# Patient Record
Sex: Male | Born: 1960 | Race: White | Hispanic: No | Marital: Married | Smoking: Never smoker
Health system: Southern US, Community
[De-identification: ages and names within clinical notes are randomized; demographics above are authoritative.]

## PROBLEM LIST (undated history)

## (undated) DIAGNOSIS — S32010A Wedge compression fracture of first lumbar vertebra, initial encounter for closed fracture: Secondary | ICD-10-CM

## (undated) HISTORY — DX: Wedge compression fracture of first lumbar vertebra, initial encounter for closed fracture: S32.010A

---

## 1984-11-09 DIAGNOSIS — S32010A Wedge compression fracture of first lumbar vertebra, initial encounter for closed fracture: Secondary | ICD-10-CM

## 1984-11-09 HISTORY — DX: Wedge compression fracture of first lumbar vertebra, initial encounter for closed fracture: S32.010A

## 2003-04-25 ENCOUNTER — Emergency Department (HOSPITAL_COMMUNITY): Admission: EM | Admit: 2003-04-25 | Discharge: 2003-04-25 | Payer: Self-pay | Admitting: Emergency Medicine

## 2018-12-15 ENCOUNTER — Encounter: Payer: Self-pay | Admitting: Physician Assistant

## 2018-12-15 ENCOUNTER — Ambulatory Visit: Payer: Self-pay | Admitting: Physician Assistant

## 2018-12-15 VITALS — BP 130/72 | HR 93 | Temp 100.2°F | Resp 14 | Wt 240.2 lb

## 2018-12-15 DIAGNOSIS — J111 Influenza due to unidentified influenza virus with other respiratory manifestations: Secondary | ICD-10-CM

## 2018-12-15 DIAGNOSIS — R69 Illness, unspecified: Secondary | ICD-10-CM

## 2018-12-15 MED ORDER — SALINE SPRAY 0.65 % NA SOLN: 1.0000 | NASAL | 0 refills | Status: DC | PRN

## 2018-12-15 MED ORDER — OSELTAMIVIR PHOSPHATE 75 MG PO CAPS: 75.0000 mg | ORAL_CAPSULE | Freq: Two times a day (BID) | ORAL | 0 refills | Status: DC

## 2018-12-15 MED ORDER — PROMETHAZINE-DM 6.25-15 MG/5ML PO SYRP: 5.0000 mL | ORAL_SOLUTION | Freq: Four times a day (QID) | ORAL | 0 refills | Status: DC | PRN

## 2018-12-15 MED ORDER — BENZOCAINE-MENTHOL 15-3.6 MG MT LOZG: 1.0000 | LOZENGE | OROMUCOSAL | 0 refills | Status: DC | PRN

## 2018-12-15 MED ORDER — BENZONATATE 100 MG PO CAPS: 100.0000 mg | ORAL_CAPSULE | Freq: Three times a day (TID) | ORAL | 0 refills | Status: DC | PRN

## 2018-12-15 NOTE — Progress Notes (Signed)
MRN: [id number] DOB: 1961/03/26  Subjective:   Jeffery Blair is a 58 y.o. male presenting for chief complaint of flu like illness.  Reports sudden onset body aches, fever, chills, sore throat, headache, runny nose, and dry cough 1 day ago. Was fine at work then got home and immediately felt sick. Denies sinus pain, inability to swallow, voice change, productive cough, wheezing, shortness of breath and chest pain, nausea, vomiting, abdominal pain and diarrhea. Has had sick contact with coworker, w/ similar sx. Has tried [facility]BC powder with some relief. Patient has not had flu shot this season. No known hx of asthma, COPD, DM, HTN, autoimmune disorder but also never goes to the doctor. Denies smoking. Denies recent travel outside the [place]. Denies any other aggravating or relieving factors, no other questions or concerns.  Review of Systems  Respiratory: Negative for hemoptysis.   Musculoskeletal: Negative for neck pain.  Skin: Negative for rash.  Neurological: Negative for dizziness and weakness.    Jeffery Blair currently has no medications in their medication list. Also has No Known Allergies.  Jeffery Blair  has no past medical history on file. Also  has no past surgical history on file.   Objective:   Vitals: BP 130/72   Pulse 93   Temp 100.2 F (37.9 C)   Resp 14   Wt 240 lb 3.2 oz (109 kg)   SpO2 96%   Physical Exam Vitals signs reviewed.  Constitutional:      General: He is not in acute distress.    Appearance: He is well-developed. He is not ill-appearing or toxic-appearing.  HENT:     Head: Normocephalic and atraumatic.     Right Ear: Ear canal and external ear normal. Tympanic membrane is injected (mildly). Tympanic membrane is not bulging.     Left Ear: Tympanic membrane, ear canal and external ear normal.     Nose: Congestion and rhinorrhea (clear) present.     Right Sinus: No maxillary sinus tenderness or frontal sinus tenderness.     Left Sinus: No maxillary sinus tenderness or  frontal sinus tenderness.     Mouth/Throat:     Lips: Pink.     Mouth: Mucous membranes are moist.     Pharynx: Uvula midline. Posterior oropharyngeal erythema present. No pharyngeal swelling, oropharyngeal exudate or uvula swelling.     Tonsils: No tonsillar exudate or tonsillar abscesses. Swelling: 1+ on the right. 1+ on the left.  Eyes:     Conjunctiva/sclera: Conjunctivae normal.  Neck:     Musculoskeletal: Normal range of motion.  Cardiovascular:     Rate and Rhythm: Normal rate and regular rhythm.     Heart sounds: Normal heart sounds.  Pulmonary:     Effort: Pulmonary effort is normal. No respiratory distress.     Breath sounds: Rhonchi (few rhonchi auscultated in RUF-cleared with cough) present. No decreased breath sounds, wheezing or rales.  Lymphadenopathy:     Head:     Right side of head: No submental, submandibular, tonsillar, preauricular, posterior auricular or occipital adenopathy.     Left side of head: No submental, submandibular, tonsillar, preauricular, posterior auricular or occipital adenopathy.     Cervical: No cervical adenopathy.     Upper Body:     Right upper body: No supraclavicular adenopathy.     Left upper body: No supraclavicular adenopathy.  Skin:    General: Skin is warm and dry.     Comments: Skin is very warm to palpation.   Neurological:  Mental Status: He is alert.     No results found for this or any previous visit (from the past 24 hour(s)).  Assessment and Plan :  1. Influenza-like illness Overall well appearing, NAD.  Temp elevated at 100.2-has not had any antipyretics today. Few rhonchi noted on exam, cleared with cough. Sudden onset of sx set is consistent with influenza despite negative POC flu. Discussed natural history of the disease. Discussed limitations of our office with pt in regards to imaging/labs.  Considering he does not follow up with a family doctor and does not recall ever having lab work as an adult-would recommend  antiviral therapy along with supportive measures at this time with close f/u at urgent care if not responding to therapy.   Rx for tamiflu provided. Encouraged rest, hydration, and to continue OTC tylenol or ibuprofen as prescribed for fever. 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 Recommended wearing a mask daily especially around other people. Educated on potential complications of the flu. Advised to follow up with urgent care or ED if develops any of these concerning symptoms or if current symptoms persist outside of discussed boundaries. Pt voices understanding.  - oseltamivir (TAMIFLU) 75 MG capsule; Take 1 capsule (75 mg total) by mouth 2 (two) times daily.  Dispense: 10 capsule; Refill: 0 - promethazine-dextromethorphan (PROMETHAZINE-DM) 6.25-15 MG/5ML syrup; Take 5 mLs by mouth 4 (four) times daily as needed for cough.  Dispense: 118 mL; Refill: 0 - benzonatate (TESSALON) 100 MG capsule; Take 1-2 capsules (100-200 mg total) by mouth 3 (three) times daily as needed for cough.  Dispense: 40 capsule; Refill: 0 - Benzocaine-Menthol (CEPACOL SORE THROAT) 15-3.6 MG LOZG; Use as directed 1 lozenge in the mouth or throat every 4 (four) hours as needed.  Dispense: 16 each; Refill: 0 - sodium chloride (OCEAN) 0.65 % SOLN nasal spray; Place 1 spray into both nostrils as needed.  Dispense: 60 mL; Refill: 0    Benjiman CoreBrittany Shalicia Craghead, PA-C  [facility] 12/15/2018 9:40 AM

## 2018-12-15 NOTE — Patient Instructions (Addendum)
Influenza, Adult   Your symptoms are consistent with the flu, you are contagious until you are fever free for 24 hours. You may take tamiflu to help with symptoms and duration. You can use over the counter tylenol or ibuprofen for general discomfort and fever. Use tessalon perles for cough. Promethazine DM at night time for cough. Nasal saline rinses for nasal congestion.  Two major complications after the flu are pneumonia and sinus infections. Please be aware of this and if you are not any better in 7-10 days or you develop worsening cough or sinus pressure, seek care at urgent care or the ED. Continue to wash your hands and wear a mask daily especially around other people.    Influenza is also called "the flu." It is an infection in the lungs, nose, and throat (respiratory tract). It is caused by a virus. The flu causes symptoms that are similar to symptoms of a cold. It also causes a high fever and body aches. The flu spreads easily from person to person (is contagious). Getting a flu shot (influenza vaccination) every year is the best way to prevent the flu. What are the causes? This condition is caused by the influenza virus. You can get the virus by:  Breathing in droplets that are in the air from the cough or sneeze of a person who has the virus.  Touching something that has the virus on it (is contaminated) and then touching your mouth, nose, or eyes. What increases the risk? Certain things may make you more likely to get the flu. These include:  Not washing your hands often.  Having close contact with many people during cold and flu season.  Touching your mouth, eyes, or nose without first washing your hands.  Not getting a flu shot every year. You may have a higher risk for the flu, along with serious problems such as a lung infection (pneumonia), if you:  Are older than 65.  Are pregnant.  Have a weakened disease-fighting system (immune system) because of a disease or  taking certain medicines.  Have a long-term (chronic) illness, such as: ? Heart, kidney, or lung disease. ? Diabetes. ? Asthma.  Have a liver disorder.  Are very overweight (morbidly obese).  Have anemia. This is a condition that affects your red blood cells. What are the signs or symptoms? Symptoms usually begin suddenly and last 4-14 days. They may include:  Fever and chills.  Headaches, body aches, or muscle aches.  Sore throat.  Cough.  Runny or stuffy (congested) nose.  Chest discomfort.  Not wanting to eat as much as normal (poor appetite).  Weakness or feeling tired (fatigue).  Dizziness.  Feeling sick to your stomach (nauseous) or throwing up (vomiting). How is this treated? If the flu is found early, you can be treated with medicine that can help reduce how bad the illness is and how long it lasts (antiviral medicine). This may be given by mouth (orally) or through an IV tube. Taking care of yourself at home can help your symptoms get better. Your doctor may suggest:  Taking over-the-counter medicines.  Drinking plenty of fluids. The flu often goes away on its own. If you have very bad symptoms or other problems, you may be treated in a hospital. Follow these instructions at home:     Activity  Rest as needed. Get plenty of sleep.  Stay home from work or school as told by your doctor. ? Do not leave home until you do not  have a fever for 24 hours without taking medicine. ? Leave home only to visit your doctor. Eating and drinking  Take an ORS (oral rehydration solution). This is a drink that is sold at pharmacies and stores.  Drink enough fluid to keep your pee (urine) pale yellow.  Drink clear fluids in small amounts as you are able. Clear fluids include: ? Water. ? Ice chips. ? Fruit juice that has water added (diluted fruit juice). ? Low-calorie sports drinks.  Eat bland, easy-to-digest foods in small amounts as you are able. These foods  include: ? Bananas. ? Applesauce. ? Rice. ? Lean meats. ? Toast. ? Crackers.  Do not eat or drink: ? Fluids that have a lot of sugar or caffeine. ? Alcohol. ? Spicy or fatty foods. General instructions  Take over-the-counter and prescription medicines only as told by your doctor.  Use a cool mist humidifier to add moisture to the air in your home. This can make it easier for you to breathe.  Cover your mouth and nose when you cough or sneeze.  Wash your hands with soap and water often, especially after you cough or sneeze. If you cannot use soap and water, use alcohol-based hand sanitizer.  Keep all follow-up visits as told by your doctor. This is important. How is this prevented?   Get a flu shot every year. You may get the flu shot in late summer, fall, or winter. Ask your doctor when you should get your flu shot.  Avoid contact with people who are sick during fall and winter (cold and flu season). Contact a doctor if:  You get new symptoms.  You have: ? Chest pain. ? Watery poop (diarrhea). ? A fever.  Your cough gets worse.  You start to have more mucus.  You feel sick to your stomach.  You throw up. Get help right away if you:  Have shortness of breath.  Have trouble breathing.  Have skin or nails that turn a bluish color.  Have very bad pain or stiffness in your neck.  Get a sudden headache.  Get sudden pain in your face or ear.  Cannot eat or drink without throwing up. Summary  Influenza ("the flu") is an infection in the lungs, nose, and throat. It is caused by a virus.  Take over-the-counter and prescription medicines only as told by your doctor.  Getting a flu shot every year is the best way to avoid getting the flu. This information is not intended to replace advice given to you by your health care provider. Make sure you discuss any questions you have with your health care provider. Document Released: 08/04/2008 Document Revised:  04/13/2018 Document Reviewed: 04/13/2018 Elsevier Interactive Patient Education  2019 [employer]

## 2018-12-19 ENCOUNTER — Other Ambulatory Visit: Payer: Self-pay | Admitting: Physician Assistant

## 2018-12-19 DIAGNOSIS — R69 Illness, unspecified: Principal | ICD-10-CM

## 2018-12-19 DIAGNOSIS — J111 Influenza due to unidentified influenza virus with other respiratory manifestations: Secondary | ICD-10-CM

## 2018-12-21 ENCOUNTER — Telehealth: Payer: Self-pay

## 2018-12-21 NOTE — Telephone Encounter (Signed)
I called the patient for follow up as the provider asked me to do it. I was no able reach out the patient.

## 2019-03-01 ENCOUNTER — Encounter: Payer: Self-pay | Admitting: Adult Health

## 2019-03-01 ENCOUNTER — Other Ambulatory Visit: Payer: Self-pay

## 2019-03-01 ENCOUNTER — Ambulatory Visit (INDEPENDENT_AMBULATORY_CARE_PROVIDER_SITE_OTHER): Payer: 59 | Admitting: Adult Health

## 2019-03-01 DIAGNOSIS — Z833 Family history of diabetes mellitus: Secondary | ICD-10-CM

## 2019-03-01 DIAGNOSIS — Z8249 Family history of ischemic heart disease and other diseases of the circulatory system: Secondary | ICD-10-CM | POA: Diagnosis not present

## 2019-03-01 DIAGNOSIS — Z83438 Family history of other disorder of lipoprotein metabolism and other lipidemia: Secondary | ICD-10-CM

## 2019-03-01 DIAGNOSIS — Z7689 Persons encountering health services in other specified circumstances: Secondary | ICD-10-CM

## 2019-03-01 DIAGNOSIS — G8929 Other chronic pain: Secondary | ICD-10-CM

## 2019-03-01 DIAGNOSIS — M5442 Lumbago with sciatica, left side: Secondary | ICD-10-CM

## 2019-03-01 DIAGNOSIS — M5441 Lumbago with sciatica, right side: Secondary | ICD-10-CM

## 2019-03-01 NOTE — Progress Notes (Addendum)
Virtual Visit via Video Note  I connected withMichael T Blair on 03/01/19 at 10:00 AM EDT by a video enabled telemedicine application and verified that I am speaking with the correct person using two identifiers.  Location patient: home Location provider:work or home office Persons participating in the virtual visit: patient, provider  I discussed the limitations of evaluation and management by telemedicine and the availability of in person appointments. The patient expressed understanding and agreed to proceed.  Patient presents to clinic today to establish care.  He is a pleasant 58 year old male who has no past medical history.  He reports that it is been "a long time" since he has been seen by PCP.  Acute Concerns: Establish Care   Chronic Issues: Low back pain -reports having a compression fracture of L1 back in 1986.  He reports that he is always had some numbness and tingling down the left leg since this time but reports that the numbness and tingling is started to get worse and is also starting to radiate down the right leg.  Numbness and tingling is intermittent.  Health Maintenance: Dental -- Does not do routine care  Vision -- Does not do on a routine basis  Immunizations --unknown Colonoscopy --uses colonoscopy and Cologuard   No current outpatient medications on file prior to visit.   No current facility-administered medications on file prior to visit.     No Known Allergies  Family History  Problem Relation Age of Onset  . Diabetes Mother   . Diabetes Father   . High blood pressure Father   . GER disease Father   . High Cholesterol Father   . Depression Father   . Restless legs syndrome Father   . Benign prostatic hyperplasia Father     Social History   Socioeconomic History  . Marital status: Married    Spouse name: Not on file  . Number of children: Not on file  . Years of education: Not on file  . Highest education level: Not on file   Occupational History  . Not on file  Social Needs  . Financial resource strain: Not on file  . Food insecurity:    Worry: Not on file    Inability: Not on file  . Transportation needs:    Medical: Not on file    Non-medical: Not on file  Tobacco Use  . Smoking status: Never Smoker  . Smokeless tobacco: Former Engineer, water and Sexual Activity  . Alcohol use: Never    Frequency: Never  . Drug use: Never  . Sexual activity: Not on file  Lifestyle  . Physical activity:    Days per week: Not on file    Minutes per session: Not on file  . Stress: Not on file  Relationships  . Social connections:    Talks on phone: Not on file    Gets together: Not on file    Attends religious service: Not on file    Active member of club or organization: Not on file    Attends meetings of clubs or organizations: Not on file    Relationship status: Not on file  . Intimate partner violence:    Fear of current or ex partner: Not on file    Emotionally abused: Not on file    Physically abused: Not on file    Forced sexual activity: Not on file  Other Topics Concern  . Not on file  Social History Narrative  . Not on  file    Review of Systems  Constitutional: Negative.   HENT: Negative.   Eyes: Negative.   Respiratory: Negative.   Cardiovascular: Negative.   Gastrointestinal: Negative.   Genitourinary: Negative.   Musculoskeletal: Negative.   Skin: Negative.   Neurological: Positive for tingling.  Endo/Heme/Allergies: Negative.   Psychiatric/Behavioral: Negative.   All other systems reviewed and are negative.   There were no vitals taken for this visit.  Physical Exam VITALS per patient if applicable:  GENERAL: alert, oriented, appears well and in no acute distress  HEENT: atraumatic, conjunttiva clear, no obvious abnormalities on inspection of external nose and ears  NECK: normal movements of the head and neck  LUNGS: on inspection no signs of respiratory distress,  breathing rate appears normal, no obvious gross SOB, gasping or wheezing  CV: no obvious cyanosis  MS: moves all visible extremities without noticeable abnormality  PSYCH/NEURO: pleasant and cooperative, no obvious depression or anxiety, speech and thought processing grossly intact  No results found for this or any previous visit (from the past 2160 hour(s)).  Assessment/Plan:  Discussed the following assessment and plan:  Encounter to establish care  Family history of diabetes mellitus - Plan: CBC with Differential/Platelet, Lipid panel, Hemoglobin A1c, CMP  Family history of hyperlipidemia - Plan: CBC with Differential/Platelet, Lipid panel, Hemoglobin A1c, CMP  Family history of hypertension - Plan: CBC with Differential/Platelet, Lipid panel, Hemoglobin A1c, CMP  Chronic bilateral low back pain with bilateral sciatica - Plan: DG Lumbar Spine Complete  We will start with baseline labs, and a x-ray of the lumbar spine.  Once COVID restrictions have lifted will bring him in for physical exam. He understands risks of forgoing colon cancer screening.    I discussed the assessment and treatment plan with the patient. The patient was provided an opportunity to ask questions and all were answered. The patient agreed with the plan and demonstrated an understanding of the instructions.   The patient was advised to call back or seek an in-person evaluation if the symptoms worsen or if the condition fails to improve as anticipated.   Shirline Freesory Jodelle Fausto, NP

## 2019-03-08 ENCOUNTER — Other Ambulatory Visit: Payer: Self-pay

## 2019-03-08 ENCOUNTER — Other Ambulatory Visit (INDEPENDENT_AMBULATORY_CARE_PROVIDER_SITE_OTHER): Payer: 59

## 2019-03-08 ENCOUNTER — Ambulatory Visit (INDEPENDENT_AMBULATORY_CARE_PROVIDER_SITE_OTHER): Payer: 59

## 2019-03-08 DIAGNOSIS — G8929 Other chronic pain: Secondary | ICD-10-CM

## 2019-03-08 DIAGNOSIS — M5442 Lumbago with sciatica, left side: Secondary | ICD-10-CM | POA: Diagnosis not present

## 2019-03-08 DIAGNOSIS — Z833 Family history of diabetes mellitus: Secondary | ICD-10-CM

## 2019-03-08 DIAGNOSIS — Z83438 Family history of other disorder of lipoprotein metabolism and other lipidemia: Secondary | ICD-10-CM

## 2019-03-08 DIAGNOSIS — Z8249 Family history of ischemic heart disease and other diseases of the circulatory system: Secondary | ICD-10-CM

## 2019-03-08 DIAGNOSIS — M5441 Lumbago with sciatica, right side: Secondary | ICD-10-CM

## 2019-03-08 LAB — COMPREHENSIVE METABOLIC PANEL
ALT: 46 U/L (ref 0–53)
AST: 30 U/L (ref 0–37)
Albumin: 4.6 g/dL (ref 3.5–5.2)
Alkaline Phosphatase: 42 U/L (ref 39–117)
BUN: 15 mg/dL (ref 6–23)
CO2: 30 mEq/L (ref 19–32)
Calcium: 9.2 mg/dL (ref 8.4–10.5)
Chloride: 102 mEq/L (ref 96–112)
Creatinine, Ser: 0.93 mg/dL (ref 0.40–1.50)
GFR: 83.15 mL/min (ref >60.00)
Glucose, Bld: 117 mg/dL — ABNORMAL HIGH (ref 70–99)
Potassium: 4 mEq/L (ref 3.5–5.1)
Sodium: 140 mEq/L (ref 135–145)
Total Bilirubin: 0.9 mg/dL (ref 0.2–1.2)
Total Protein: 7 g/dL (ref 6.0–8.3)

## 2019-03-08 LAB — CBC WITH DIFFERENTIAL/PLATELET
Basophils Absolute: 0.1 10*3/uL (ref 0.0–0.1)
Basophils Relative: 0.6 % (ref 0.0–3.0)
Eosinophils Absolute: 0.4 10*3/uL (ref 0.0–0.7)
Eosinophils Relative: 5.4 % — ABNORMAL HIGH (ref 0.0–5.0)
HCT: 47.5 % (ref 39.0–52.0)
Hemoglobin: 16.6 g/dL (ref 13.0–17.0)
Lymphocytes Relative: 33.6 % (ref 12.0–46.0)
Lymphs Abs: 2.8 10*3/uL (ref 0.7–4.0)
MCHC: 34.9 g/dL (ref 30.0–36.0)
MCV: 89.8 fl (ref 78.0–100.0)
Monocytes Absolute: 0.7 10*3/uL (ref 0.1–1.0)
Monocytes Relative: 8.3 % (ref 3.0–12.0)
Neutro Abs: 4.3 10*3/uL (ref 1.4–7.7)
Neutrophils Relative %: 52.1 % (ref 43.0–77.0)
Platelets: 210 10*3/uL (ref 150.0–400.0)
RBC: 5.28 Mil/uL (ref 4.22–5.81)
RDW: 13.3 % (ref 11.5–15.5)
WBC: 8.2 10*3/uL (ref 4.0–10.5)

## 2019-03-08 LAB — LIPID PANEL
Cholesterol: 173 mg/dL (ref 0–200)
HDL: 40.6 mg/dL (ref >39.00)
LDL Cholesterol: 104 mg/dL — ABNORMAL HIGH (ref 0–99)
NonHDL: 132.76
Total CHOL/HDL Ratio: 4
Triglycerides: 144 mg/dL (ref 0.0–149.0)
VLDL: 28.8 mg/dL (ref 0.0–40.0)

## 2019-03-08 LAB — HEMOGLOBIN A1C: Hgb A1c MFr Bld: 5.9 % (ref 4.6–6.5)

## 2019-03-09 ENCOUNTER — Telehealth: Payer: Self-pay | Admitting: *Deleted

## 2019-03-09 ENCOUNTER — Other Ambulatory Visit: Payer: Self-pay | Admitting: Family Medicine

## 2019-03-09 DIAGNOSIS — M545 Low back pain, unspecified: Secondary | ICD-10-CM

## 2019-03-09 NOTE — Telephone Encounter (Signed)
Spoke to the pt and advised that ortho will need to write for inversion chair. Nothing further needed.

## 2019-03-09 NOTE — Telephone Encounter (Signed)
Copied from CRM [id number]. Topic: General - Call Back - No Documentation >> Mar 09, 2019 10:27 AM Reggie Pile, NT wrote: Reason for CRM: Patient is calling back stating he missed a call from [place]. Call back is [phone].

## 2019-03-15 ENCOUNTER — Ambulatory Visit (INDEPENDENT_AMBULATORY_CARE_PROVIDER_SITE_OTHER): Payer: 59 | Admitting: Orthopaedic Surgery

## 2019-03-15 ENCOUNTER — Encounter: Payer: Self-pay | Admitting: Orthopaedic Surgery

## 2019-03-15 ENCOUNTER — Other Ambulatory Visit: Payer: Self-pay

## 2019-03-15 VITALS — Ht 70.5 in | Wt 230.0 lb

## 2019-03-15 DIAGNOSIS — S32000S Wedge compression fracture of unspecified lumbar vertebra, sequela: Secondary | ICD-10-CM

## 2019-03-15 DIAGNOSIS — M545 Low back pain, unspecified: Secondary | ICD-10-CM

## 2019-03-15 DIAGNOSIS — G8929 Other chronic pain: Secondary | ICD-10-CM | POA: Diagnosis not present

## 2019-03-15 NOTE — Progress Notes (Signed)
Office Visit Note   Patient: Jeffery Blair           Date of Birth: [birth year]           MRN: [id number] Visit Date: 03/15/2019              Requested by: Shirline Frees, NP [address] [place], [place] [address] PCP: Shirline Frees, NP   Assessment & Plan: Visit Diagnoses:  1. Lumbar compression fracture, sequela   2. Chronic bilateral low back pain without sciatica     Plan: We discussed working on some weight loss and core strengthening to help with his back he states he like to defer formal physical therapy at this point.  Take 2 Aleve twice a day work on walking program weight loss core strengthening and can return in 2 months.  Reviewed the x-rays that showed some mild degenerative changes in the lumbar spine and his L1 compression fracture is healed and stable.  Recheck 2 months.  If he gets increased symptoms we can consider diagnostic imaging.  Follow-Up Instructions: Return in about 2 months (around 05/15/2019).   Orders:  No orders of the defined types were placed in this encounter.  No orders of the defined types were placed in this encounter.     Procedures: No procedures performed   Clinical Data: No additional findings.   Subjective: Chief Complaint  Patient presents with  . Lower Back - Pain    HPI 58 year old male seen with chronic low back pain started in 1986 with history of L1 compression fracture.  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  Since 85 he had some problems with chronic back pain and some pain in his left leg sometimes some left knee pain sometimes numbness radiates down to his left foot.  Recently in the last several months he has had numbness or tingling over the anterior lateral or anterior aspect of his right thigh stops at his knee.  Has not noticed any weakness in his right leg no problems with stairs.  He continues his normal activity when this  occurs and usually occurs when he standing.  After period of time it resolves.  Does not occur when he sitting or supine.  No associated bowel or bladder symptoms.  Recent x-rays on 03/08/2019 showed old appearing superior endplate fracture at L1 which was about 40%.  Patient had some degenerative changes at other levels no spondylo-listhesis.  No bowel or bladder symptoms no fever chills.  Review of Systems past smoker quit years ago.  History of L1 compression fracture 1986 otherwise negative as pertains to HPI.   Objective: Vital Signs: Ht 5' 10.5" (1.791 m)   Wt 230 lb (104.3 kg)   BMI 32.54 kg/m   Physical Exam Constitutional:      Appearance: He is well-developed.  HENT:     Head: Normocephalic and atraumatic.  Eyes:     Pupils: Pupils are equal, round, and reactive to light.  Neck:     Thyroid: No thyromegaly.     Trachea: No tracheal deviation.  Cardiovascular:     Rate and Rhythm: Normal rate.  Pulmonary:     Effort: Pulmonary effort is normal.     Breath sounds: No wheezing.  Abdominal:     General: Bowel sounds are normal.     Palpations: Abdomen is soft.  Skin:    General: Skin is warm and dry.  Capillary Refill: Capillary refill takes less than 2 seconds.  Neurological:     Mental Status: He is alert and oriented to person, place, and time.  Psychiatric:        Behavior: Behavior normal.        Thought Content: Thought content normal.        Judgment: Judgment normal.     Ortho Exam patient has negative logroll the hips knee and ankle jerk are 2+ and symmetrical negative straight leg raising 90 degrees.  Good hip flexion strength bilaterally anterior tib gastrocsoleus is normal normal heel and toe walking without deficit.  Knee shows slight crepitus on the left knee but full extension good flexion.  No knee effusion is noted.  Good capillary refill no rash over exposed skin.  Specialty Comments:  No specialty comments available.  Imaging: No results  found.   PMFS History: There are no active problems to display for this patient.  Past Medical History:  Diagnosis Date  . Compression fracture of L1 lumbar vertebra (HCC) 1986    Family History  Problem Relation Age of Onset  . Diabetes Mother   . Diabetes Father   . High blood pressure Father   . GER disease Father   . High Cholesterol Father   . Depression Father   . Restless legs syndrome Father   . Benign prostatic hyperplasia Father   . Prostate cancer Father     No past surgical history on file. Social History   Occupational History  . Not on file  Tobacco Use  . Smoking status: Never Smoker  . Smokeless tobacco: Former Engineer, waterUser  Substance and Sexual Activity  . Alcohol use: Never    Frequency: Never  . Drug use: Never  . Sexual activity: Not on file

## 2019-03-22 ENCOUNTER — Telehealth: Payer: Self-pay | Admitting: *Deleted

## 2019-03-22 NOTE — Telephone Encounter (Signed)
Order has been resubmitted to [employer]

## 2019-03-22 NOTE — Telephone Encounter (Signed)
Copied from [place]. Topic: General - Other >> Mar 22, 2019 11:38 AM Ivar Drape wrote: Reason for CRM:  Patient stated he has not received his Cologuard testing kit yet.  It has been a couple of weeks.  Please advise.

## 2019-05-16 ENCOUNTER — Ambulatory Visit: Payer: 59 | Admitting: Orthopaedic Surgery

## 2019-11-22 ENCOUNTER — Telehealth: Payer: Self-pay

## 2019-11-22 NOTE — Telephone Encounter (Signed)
Copied from CRM [id number]. Topic: General - Other >> Nov 22, 2019  1:19 PM Gwenlyn Fudge wrote: Reason for CRM: Pts wife called stating that pts results came back positive. Please advise.

## 2019-11-23 NOTE — Telephone Encounter (Signed)
Called pt spouse yesterday. No further action needed.

## 2019-11-28 ENCOUNTER — Other Ambulatory Visit: Payer: Self-pay

## 2019-11-28 ENCOUNTER — Telehealth (INDEPENDENT_AMBULATORY_CARE_PROVIDER_SITE_OTHER): Payer: 59 | Admitting: Adult Health

## 2019-11-28 ENCOUNTER — Encounter: Payer: Self-pay | Admitting: Adult Health

## 2019-11-28 ENCOUNTER — Other Ambulatory Visit: Payer: Self-pay | Admitting: Physician Assistant

## 2019-11-28 DIAGNOSIS — U071 COVID-19: Secondary | ICD-10-CM | POA: Diagnosis not present

## 2019-11-28 DIAGNOSIS — J988 Other specified respiratory disorders: Secondary | ICD-10-CM | POA: Diagnosis not present

## 2019-11-28 DIAGNOSIS — J111 Influenza due to unidentified influenza virus with other respiratory manifestations: Secondary | ICD-10-CM

## 2019-11-28 MED ORDER — ALBUTEROL SULFATE HFA 108 (90 BASE) MCG/ACT IN AERS: 2.0000 | INHALATION_SPRAY | Freq: Four times a day (QID) | RESPIRATORY_TRACT | 1 refills | Status: DC | PRN

## 2019-11-28 NOTE — Progress Notes (Signed)
Virtual Visit via Video Note  I connected with Jeffery Blair on 11/28/19 at  4:30 PM EST by a video enabled telemedicine application and verified that I am speaking with the correct person using two identifiers.  Location patient: home Location provider:work or home office Persons participating in the virtual visit: patient, provider  I discussed the limitations of evaluation and management by telemedicine and the availability of in person appointments. The patient expressed understanding and agreed to proceed.   HPI: 59 year old male who is being evaluated today for an acute issue.  He reports that he tested positive for Covid on November 19, 2018.  He had this test done at [facility].  His only symptom was the feeling of chest congestion.  He reports that his symptoms started at the end of December.  Today he reports that he continues to have feeling of chest congestion, this is a tight feeling.  He does have a semiproductive cough but no wheezing no shortness of breath no fevers or chills.  He is using over-the-counter generic Mucinex as well as NyQuil.  Symptoms are worse in the morning.  He is staying well-hydrated.     ROS: See pertinent positives and negatives per HPI.  Past Medical History:  Diagnosis Date  . Compression fracture of L1 lumbar vertebra (HCC) 1986    History reviewed. No pertinent surgical history.  Family History  Problem Relation Age of Onset  . Diabetes Mother   . Diabetes Father   . High blood pressure Father   . GER disease Father   . High Cholesterol Father   . Depression Father   . Restless legs syndrome Father   . Benign prostatic hyperplasia Father   . Prostate cancer Father       No current outpatient medications on file.  EXAM:  VITALS per patient if applicable:  GENERAL: alert, oriented, appears well and in no acute distress  HEENT: atraumatic, conjunttiva clear, no obvious abnormalities on inspection of external nose and ears  NECK: normal  movements of the head and neck  LUNGS: on inspection no signs of respiratory distress, breathing rate appears normal, no obvious gross SOB, gasping or wheezing  CV: no obvious cyanosis  MS: moves all visible extremities without noticeable abnormality  PSYCH/NEURO: pleasant and cooperative, no obvious depression or anxiety, speech and thought processing grossly intact  ASSESSMENT AND PLAN:  Discussed the following assessment and plan:  1. Respiratory tract infection due to COVID-19 virus -Does not appear to have pneumonia.  Likely viral respiratory infection from Covid.  Will send in albuterol inhaler and have him use a humidifier at bedside.  Continue with over-the-counter treatments and drink plenty of fluid.  Follow-up Friday if no improvement and we will keep him out of work for a few more days - albuterol (VENTOLIN HFA) 108 (90 Base) MCG/ACT inhaler; Inhale 2 puffs into the lungs every 6 (six) hours as needed for wheezing or shortness of breath.  Dispense: 6.7 g; Refill: 1     I discussed the assessment and treatment plan with the patient. The patient was provided an opportunity to ask questions and all were answered. The patient agreed with the plan and demonstrated an understanding of the instructions.   The patient was advised to call back or seek an in-person evaluation if the symptoms worsen or if the condition fails to improve as anticipated.   Shirline Frees, NP

## 2019-12-11 ENCOUNTER — Ambulatory Visit (INDEPENDENT_AMBULATORY_CARE_PROVIDER_SITE_OTHER): Payer: 59

## 2019-12-11 ENCOUNTER — Ambulatory Visit: Payer: 59 | Admitting: Family Medicine

## 2019-12-11 ENCOUNTER — Encounter: Payer: Self-pay | Admitting: Family Medicine

## 2019-12-11 ENCOUNTER — Other Ambulatory Visit: Payer: Self-pay

## 2019-12-11 VITALS — BP 148/86 | HR 72 | Temp 97.7°F | Ht 70.5 in | Wt 234.0 lb

## 2019-12-11 DIAGNOSIS — M25561 Pain in right knee: Secondary | ICD-10-CM

## 2019-12-11 NOTE — Patient Instructions (Signed)
Try some icing 15-20 two to three time daily  Continue Aleve as needed  We will call with X-ray results.

## 2019-12-11 NOTE — Progress Notes (Signed)
  Subjective:     Patient ID: Jeffery Blair, male   DOB: [birth year], 58 y.o.   MRN: [id number]  HPI   Jeffery Blair is seen as a work in with right knee pain.  He states this started yesterday when he was going down some steps.  He felt a "popping "sensation lateral aspect of the right knee.  This was associated with some sharp pain.  He did not have any actual injury.  He was able to bear weight but has had significant pain since then.  He took some Aleve which has helped.  He has pain with lateral side to side movement and going up and down stairs predominately.  Pain seems to be confined to the lateral aspect of the right knee.  He has not had any swelling or bruising.  Denies any prior significant knee difficulties.  No locking or giving way today.  Past Medical History:  Diagnosis Date  . Compression fracture of L1 lumbar vertebra (HCC) 1986   History reviewed. No pertinent surgical history.  reports that he has never smoked. He quit smokeless tobacco use about 2 years ago. He reports that he does not drink alcohol or use drugs. family history includes Benign prostatic hyperplasia in his father; Depression in his father; Diabetes in his father and mother; GER disease in his father; High Cholesterol in his father; High blood pressure in his father; Prostate cancer in his father; Restless legs syndrome in his father. No Known Allergies   Review of Systems  Constitutional: Negative for chills and fever.  Neurological: Negative for weakness and numbness.       Objective:   Physical Exam Vitals reviewed.  Constitutional:      Appearance: Normal appearance.  Cardiovascular:     Rate and Rhythm: Normal rate and regular rhythm.  Musculoskeletal:     Comments: Right knee reveals no effusion.  No ecchymosis.  No erythema.  Full range of motion.  He has no warmth.  He has some lateral tenderness near the distal iliotibial band.  Collateral ligament testing and cruciate ligament testing is  normal  Neurological:     Mental Status: He is alert.        Assessment:     Right lateral knee pain.  Question iliotibial tendon strain.  Does not seem to have any major structural instability at this time.    Plan:     -X-rays obtained right knee -Recommend icing 15 to 20 minutes 3-4 times daily -Continue Aleve as tolerated -Touch base if not improving over the next couple weeks.  Consider sports medicine referral if not improving  Kristian Covey MD [facility]

## 2019-12-12 ENCOUNTER — Telehealth: Payer: Self-pay | Admitting: Adult Health

## 2019-12-12 NOTE — Telephone Encounter (Signed)
Please see message. °

## 2019-12-12 NOTE — Telephone Encounter (Signed)
We already recommended him ice this 15 to 20 minutes several times daily and continue over-the-counter anti-inflammatory.  Also suggested consideration for elastic knee sleeve for additional support.  He does not really need to stay off his feet but does need to avoid climbing or squatting type activities

## 2019-12-12 NOTE — Telephone Encounter (Signed)
Okay to produce work note to keep him out from 12/11/2019 through 12/24/2019

## 2019-12-12 NOTE — Telephone Encounter (Signed)
Pt is needing a work note for staying off of his knee for a couple of weeks so that it can heal.  Pts wife would like to know if this is really a recommendation for the pt to stay off of it.  They would like to have a call back to discuss what the steps would be.  Pt was seen by Dr. Caryl Never on 12/11/2019.

## 2019-12-12 NOTE — Telephone Encounter (Signed)
Called patient and let him know that work note has been approved and we will send through MyChart to him. Patient verbalized an understanding.

## 2019-12-12 NOTE — Telephone Encounter (Signed)
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 Patient stated that his job does not allow light duty.  Patient is requesting per his OV conversation that 2 weeks could allow natural healing and is asking for a 2 week work note so he can heal to be able to climb and return to normal duties for his job.

## 2019-12-18 ENCOUNTER — Telehealth: Payer: Self-pay | Admitting: Adult Health

## 2019-12-18 NOTE — Telephone Encounter (Signed)
Limited Wage Continuation Plan form to be filled out - placed in dr's folder.  Call [phone] upon completion.

## 2019-12-19 NOTE — Telephone Encounter (Signed)
I did not see form in Jeffery Blair's folder. Please advise

## 2019-12-20 NOTE — Telephone Encounter (Signed)
Form found and filled out. Picked up 12/20/2019 at 3:01 pm

## 2020-01-24 ENCOUNTER — Other Ambulatory Visit: Payer: Self-pay | Admitting: Adult Health

## 2020-01-24 DIAGNOSIS — U071 COVID-19: Secondary | ICD-10-CM

## 2020-01-24 DIAGNOSIS — J988 Other specified respiratory disorders: Secondary | ICD-10-CM

## 2020-01-24 NOTE — Telephone Encounter (Signed)
This was prescribed short term while getting over covid. Does he still have shortness of breath?

## 2020-11-15 ENCOUNTER — Telehealth: Payer: 59 | Admitting: Adult Health

## 2020-11-15 ENCOUNTER — Encounter: Payer: Self-pay | Admitting: Adult Health

## 2020-11-15 VITALS — Wt 230.0 lb

## 2020-11-15 DIAGNOSIS — J988 Other specified respiratory disorders: Secondary | ICD-10-CM | POA: Diagnosis not present

## 2020-11-15 DIAGNOSIS — U071 COVID-19: Secondary | ICD-10-CM | POA: Diagnosis not present

## 2020-11-15 MED ORDER — DOXYCYCLINE HYCLATE 100 MG PO CAPS: 100.0000 mg | ORAL_CAPSULE | Freq: Two times a day (BID) | ORAL | 0 refills | Status: AC

## 2020-11-15 MED ORDER — PREDNISONE 10 MG PO TABS: ORAL_TABLET | ORAL | 0 refills | Status: AC

## 2020-11-15 NOTE — Progress Notes (Signed)
Virtual Visit via Video Note  I connected with Jeffery Blair on 11/15/20 at  4:00 PM EST by a video enabled telemedicine application and verified that I am speaking with the correct person using two identifiers.  Location patient: home Location provider:work or home office Persons participating in the virtual visit: patient, provider  I discussed the limitations of evaluation and management by telemedicine and the availability of in person appointments. The patient expressed understanding and agreed to proceed.   HPI: This 60 year old male who is being evaluated today for an acute issue.  His symptoms started roughly 5 days ago.  Symptoms include sinus congestion, pain and pressure, wheezing, semi productive cough, chest congestion and subjective fevers.  He did have a COVID test done earlier this week as well as a flu test and both were negative.  He has been taking over-the-counter Tylenol as well as cold and cough medication which helped keep his fever symptoms at bay.  He has not been checking his temperature at home.   ROS: See pertinent positives and negatives per HPI.  Past Medical History:  Diagnosis Date  . Compression fracture of L1 lumbar vertebra (HCC) 1986    History reviewed. No pertinent surgical history.  Family History  Problem Relation Age of Onset  . Diabetes Mother   . Diabetes Father   . High blood pressure Father   . GER disease Father   . High Cholesterol Father   . Depression Father   . Restless legs syndrome Father   . Benign prostatic hyperplasia Father   . Prostate cancer Father        Current Outpatient Medications:  .  doxycycline (VIBRAMYCIN) 100 MG capsule, Take 1 capsule (100 mg total) by mouth 2 (two) times daily., Disp: 14 capsule, Rfl: 0 .  predniSONE (DELTASONE) 10 MG tablet, 40 mg x 3 days, 20 mg x 3 days, 10 mg x 3 days, Disp: 21 tablet, Rfl: 0  EXAM:  VITALS per patient if applicable:  GENERAL: alert, oriented, appears well and in  no acute distress  HEENT: atraumatic, conjunttiva clear, no obvious abnormalities on inspection of external nose and ears  NECK: normal movements of the head and neck  LUNGS: on inspection no signs of respiratory distress, breathing rate appears normal, no obvious gross SOB, gasping or wheezing  CV: no obvious cyanosis  MS: moves all visible extremities without noticeable abnormality  PSYCH/NEURO: pleasant and cooperative, no obvious depression or anxiety, speech and thought processing grossly intact  ASSESSMENT AND PLAN:  Discussed the following assessment and plan:  1. Respiratory tract infection due to COVID-19 virus -We'll treat due to symptoms and ongoing fevers.  Advise follow-up if no improvement in next 3 to 4 days or sooner if symptoms worsen - doxycycline (VIBRAMYCIN) 100 MG capsule; Take 1 capsule (100 mg total) by mouth 2 (two) times daily.  Dispense: 14 capsule; Refill: 0 - predniSONE (DELTASONE) 10 MG tablet; 40 mg x 3 days, 20 mg x 3 days, 10 mg x 3 days  Dispense: 21 tablet; Refill: 0     I discussed the assessment and treatment plan with the patient. The patient was provided an opportunity to ask questions and all were answered. The patient agreed with the plan and demonstrated an understanding of the instructions.   The patient was advised to call back or seek an in-person evaluation if the symptoms worsen or if the condition fails to improve as anticipated.   Shirline Frees, NP

## 2021-04-12 IMAGING — DX DG KNEE COMPLETE 4+V*R*
4 series · 4 of 4 positions shown · non-contrast
Comparison: None.

CLINICAL DATA: Right lateral knee pain.

EXAM:
RIGHT KNEE - COMPLETE 4+ VIEW

[knee ap (1 of 2)]
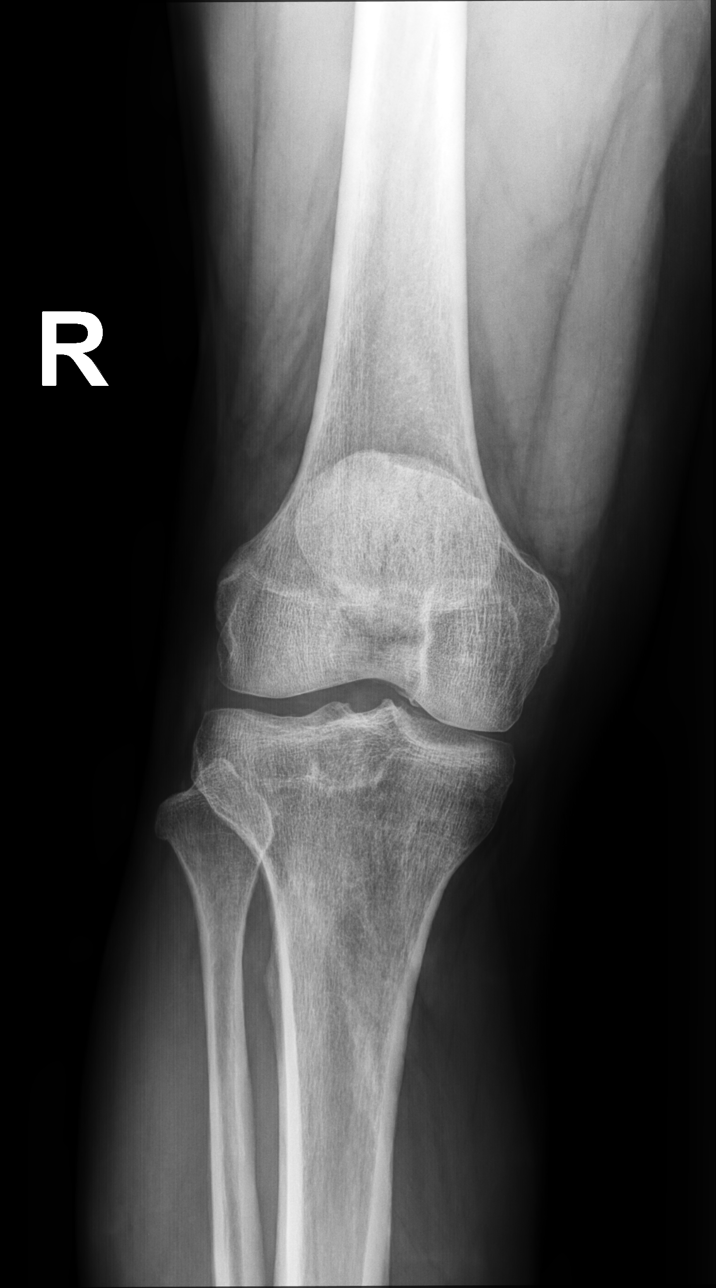

[knee ap (2 of 2)]
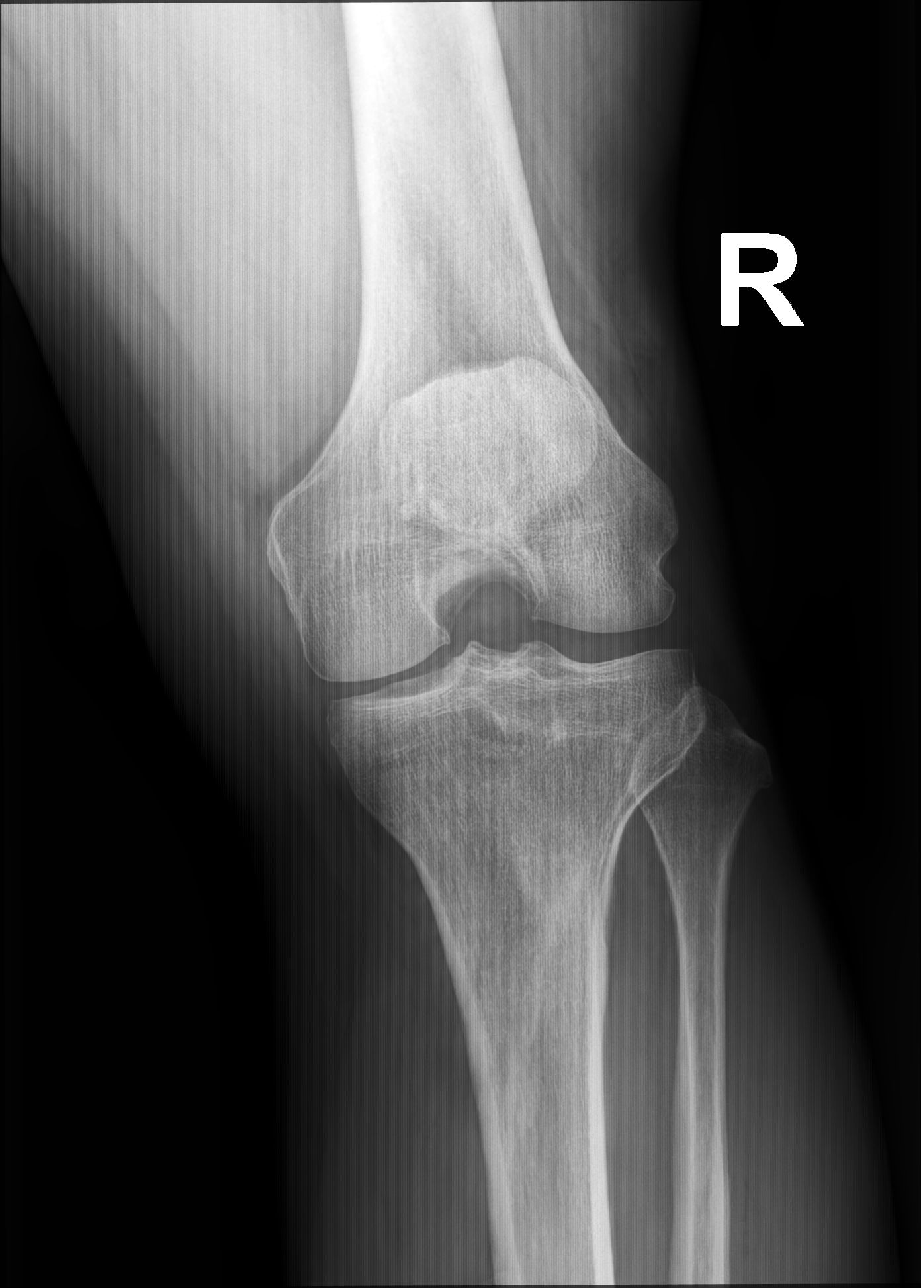

[knee lat]
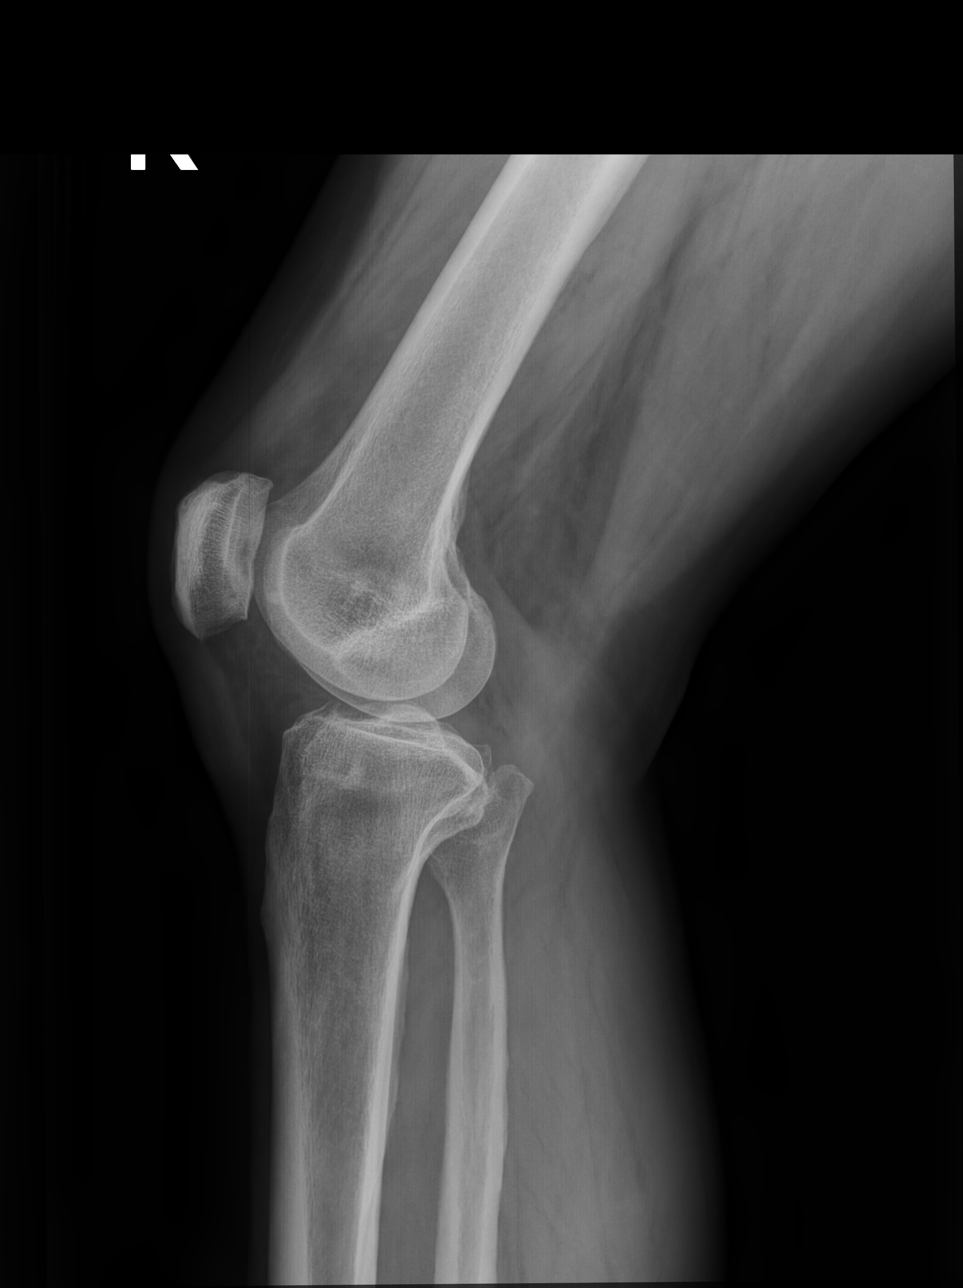

[patella lat]
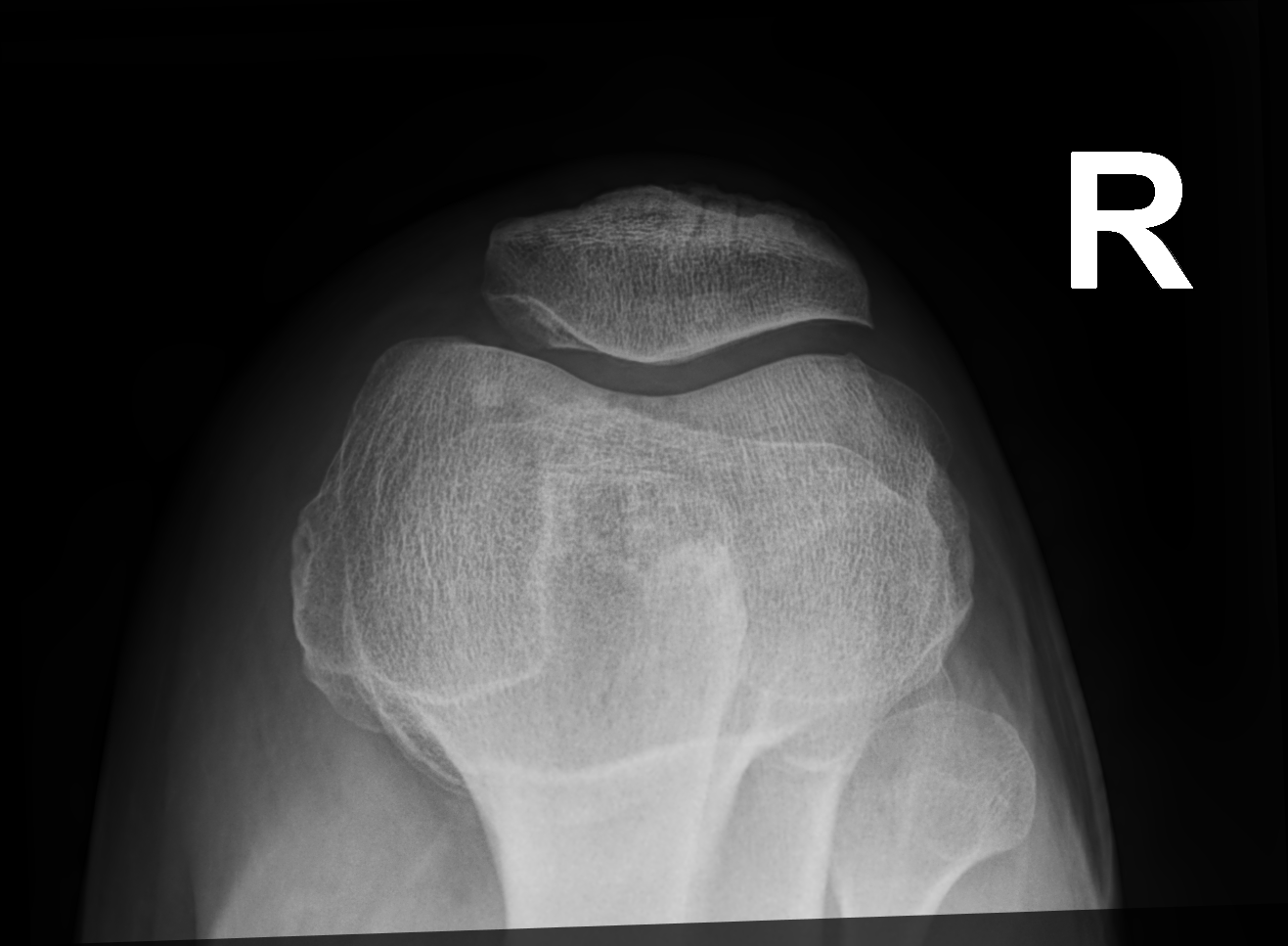

[4 of 4 positions shown; findings below may reference images not displayed]

FINDINGS: There is no fracture or dislocation or joint effusion. Tiny
osteophytes on the patella. No joint space narrowing.
IMPRESSION: Minimal degenerative changes of the patellofemoral compartment.

## 2023-11-16 ENCOUNTER — Telehealth: Payer: Self-pay | Admitting: Adult Health

## 2023-11-16 NOTE — Telephone Encounter (Signed)
 Pt was called and it has been over a year and pt need a cpe or office visit
